# Patient Record
Sex: Female | Born: 1965 | Race: Black or African American | Hispanic: No | Marital: Married | Smoking: Never smoker
Health system: Southern US, Community
[De-identification: ages and names within clinical notes are randomized; demographics above are authoritative.]

## PROBLEM LIST (undated history)

## (undated) HISTORY — PX: ABDOMINAL HYSTERECTOMY: SHX81

---

## 1998-10-11 ENCOUNTER — Ambulatory Visit (HOSPITAL_COMMUNITY): Admission: RE | Admit: 1998-10-11 | Discharge: 1998-10-11 | Payer: Self-pay | Admitting: *Deleted

## 2004-05-10 ENCOUNTER — Other Ambulatory Visit: Admission: RE | Admit: 2004-05-10 | Discharge: 2004-05-10 | Payer: Self-pay | Admitting: Family Medicine

## 2006-06-17 ENCOUNTER — Other Ambulatory Visit: Admission: RE | Admit: 2006-06-17 | Discharge: 2006-06-17 | Payer: Self-pay | Admitting: Family Medicine

## 2009-04-25 ENCOUNTER — Other Ambulatory Visit: Admission: RE | Admit: 2009-04-25 | Discharge: 2009-04-25 | Payer: Self-pay | Admitting: Family Medicine

## 2010-04-06 ENCOUNTER — Encounter (HOSPITAL_COMMUNITY)
Admission: RE | Admit: 2010-04-06 | Discharge: 2010-04-06 | Disposition: A | Discharge status: Home / Self Care | Payer: 59 | Source: Ambulatory Visit | Attending: Obstetrics and Gynecology | Admitting: Obstetrics and Gynecology

## 2010-04-06 DIAGNOSIS — Z01812 Encounter for preprocedural laboratory examination: Secondary | ICD-10-CM | POA: Insufficient documentation

## 2010-04-06 LAB — CBC
MCV: 84 fL (ref 78.0–100.0)
Platelets: 290 10*3/uL (ref 150–400)

## 2010-04-06 LAB — SURGICAL PCR SCREEN
MRSA, PCR: NEGATIVE
Staphylococcus aureus: POSITIVE — AB

## 2010-04-13 ENCOUNTER — Observation Stay (HOSPITAL_COMMUNITY)
Admission: RE | Admit: 2010-04-13 | Discharge: 2010-04-15 | Disposition: A | Discharge status: Home Health | Payer: 59 | Source: Ambulatory Visit | Attending: Obstetrics and Gynecology | Admitting: Obstetrics and Gynecology

## 2010-04-13 DIAGNOSIS — N920 Excessive and frequent menstruation with regular cycle: Principal | ICD-10-CM | POA: Insufficient documentation

## 2010-04-13 DIAGNOSIS — D259 Leiomyoma of uterus, unspecified: Secondary | ICD-10-CM | POA: Insufficient documentation

## 2010-04-13 LAB — URINALYSIS, MICROSCOPIC ONLY
Ketones, ur: NEGATIVE mg/dL
Leukocytes, UA: NEGATIVE
Nitrite: NEGATIVE
Protein, ur: NEGATIVE mg/dL
Urine Glucose, Fasting: NEGATIVE mg/dL
Urobilinogen, UA: 0.2 mg/dL (ref 0.0–1.0)

## 2010-04-13 LAB — BASIC METABOLIC PANEL
BUN: 10 mg/dL (ref 6–23)
CO2: 25 mEq/L (ref 19–32)
Calcium: 9.7 mg/dL (ref 8.4–10.5)
Chloride: 103 mEq/L (ref 96–112)
Creatinine, Ser: 0.95 mg/dL (ref 0.4–1.2)
Glucose, Bld: 98 mg/dL (ref 70–99)

## 2010-04-14 LAB — CBC
Hemoglobin: 11.5 g/dL — ABNORMAL LOW (ref 12.0–15.0)
MCH: 29.5 pg (ref 26.0–34.0)
MCV: 88.5 fL (ref 78.0–100.0)
Platelets: 321 10*3/uL (ref 150–400)
RBC: 3.9 MIL/uL (ref 3.87–5.11)
WBC: 14.7 10*3/uL — ABNORMAL HIGH (ref 4.0–10.5)

## 2010-04-30 NOTE — Op Note (Signed)
[name], [name]           ACCOUNT NO.:  [id number]  MEDICAL RECORD NO.:  [id number]           PATIENT TYPE:  I  LOCATION:  9316                          FACILITY:  [facility]  PHYSICIAN:  Gerald Leitz, MD          DATE OF BIRTH:  1965/04/18  DATE OF PROCEDURE:  04/13/2010 DATE OF DISCHARGE:                              OPERATIVE REPORT   PREOPERATIVE DIAGNOSES: 1. Uterine fibroids. 2. Menorrhagia.  POSTOPERATIVE DIAGNOSES: 1. Uterine fibroids. 2. Menorrhagia. 3. Pelvic adhesive disease.  PROCEDURE:  Supracervical abdominal hysterectomy and lysis of adhesions.  SURGEON:  Gerald Leitz, MD  ASSISTANT:  Patsy Baltimore, MD  ANESTHESIA:  General.  FINDINGS:  Multiple uterine fibroids, 20-week size uterus, adhesions of the bladder to the lower uterine segment and cervix.  SPECIMEN:  Uterus and fibroids.  DISPOSITION OF SPECIMEN:  Pathology.  ESTIMATED BLOOD LOSS:  200 mL.  WEIGHT OF THE SPECIMEN:  1146 grams.  URINE OUTPUT:  400 plus mL.  FLUIDS:  Per Anesthesia.  COMPLICATIONS:  None.  DESCRIPTION OF PROCEDURE:  The patient was taken to the operating room where she was placed under general anesthesia.  She was prepped and draped in the usual sterile fashion and a Foley catheter was placed. She was placed in the supine position.  A midline incision was made with a scalpel, carried down to the underlying layer of fascia.  The fascia was incised and extended superiorly-inferiorly.  The rectus muscles were separated in the midline.  The peritoneum was identified and entered sharply.  The incision was extended superiorly-inferiorly with good visualization of the bladder.  The pelvis was examined with the findings noted above.  Due to the size of the uterus, a retractor was used initially.  The large fibroid uterus was lifted through the abdominal incision and the round ligaments were identified bilaterally.  They were clamped, transected, and suture ligated with 0-Vicryl.   The anterior leaf of the broad ligament was incised along the bladder reflection to the midline from both sides.  There were extensive adhesions on the right side.  These adhesions were released with Metzenbaum scissors. The bladder was gently dissected off the lower uterine segment of the cervix with a sponge stick.  The utero-ovarian ligaments on both sides were doubly clamped, transected, and suture ligated with 0-Vicryl.  The uterine arteries were then skeletonized bilaterally, clamped with Zeppelin clamps, transected, and suture ligated with 0-Vicryl.  At this point, due to the size of the uterus, decision was made to amputate the uterus from the cervix.  This was performed with a scalpel.  Cervical stump was oversewn with figure-of-eight sutures of 0-Vicryl.  The ovaries were examined bilaterally.  There was some slight oozing from the left ovary and a 2-0 Vicryl suture was placed.  Excellent hemostasis was then noted.  The abdomen was copiously irrigated.  The ovarian pedicle was then transfixed or anchored to the ipsilateral round ligament bilaterally.  Again, excellent hemostasis was noted.  The fascia was closed with zero double-stranded PDS.  The subcutaneous adipose tissue was reapproximated with 2-0 Vicryl in a running fashion. The skin was closed with  staples.  Sponge, lap, and needle counts were correct x2.  The patient was taken to the recovery room awake and in a stable condition.     Gerald Leitz, MD     TC/MEDQ  D:  04/13/2010  T:  04/14/2010  Job:  [id number]  Electronically Signed by Gerald Leitz MD on 04/30/2010 11:11:57 AM

## 2010-05-24 NOTE — Discharge Summary (Signed)
  Sheila, [name]           ACCOUNT NO.:  [id number]  MEDICAL RECORD NO.:  [id number]           PATIENT TYPE:  I  LOCATION:  9316                          FACILITY:  [facility]  PHYSICIAN:  Gerald Leitz, MD          DATE OF BIRTH:  06/29/1965  DATE OF ADMISSION:  04/13/2010 DATE OF DISCHARGE:  04/15/2010                              DISCHARGE SUMMARY   ADMISSION DIAGNOSES: 1. Uterine fibroids. 2. Menorrhagia.  DISCHARGE DIAGNOSIS: 1. Uterine fibroids. 2. Menorrhagia. 3. Pelvic adhesive disease. 4. Status post supracervical abdominal hysterectomy with lysis of     adhesions.  BRIEF HOSPITAL COURSE:  The patient underwent supracervical abdominal hysterectomy and lysis of adhesions on April 13, 2010, secondary to multiple uterine fibroids.  She was noted to have adhesions of the bladder in lower uterine segment during surgery and lysis of adhesions was performed as well.  She did well postoperatively.  Her hemoglobin on postop day #1 was 11.5.  She is discharged home on postop day #2 in stable and improved condition on the following medications:  Motrin and Percocet.  She will follow up on February 14 or 15th for staple removal.  CONDITION ON DISCHARGE:  Stable and improved.     Gerald Leitz, MD     TC/MEDQ  D:  05/11/2010  T:  05/11/2010  Job:  [id number]  Electronically Signed by Gerald Leitz MD on 05/24/2010 08:44:31 AM

## 2012-03-25 ENCOUNTER — Other Ambulatory Visit: Payer: Self-pay | Admitting: Family Medicine

## 2012-03-25 DIAGNOSIS — Z1231 Encounter for screening mammogram for malignant neoplasm of breast: Secondary | ICD-10-CM

## 2015-04-07 ENCOUNTER — Encounter (HOSPITAL_BASED_OUTPATIENT_CLINIC_OR_DEPARTMENT_OTHER): Payer: Self-pay | Admitting: Emergency Medicine

## 2015-04-07 ENCOUNTER — Emergency Department (HOSPITAL_BASED_OUTPATIENT_CLINIC_OR_DEPARTMENT_OTHER)
Admission: EM | Admit: 2015-04-07 | Discharge: 2015-04-07 | Disposition: A | Discharge status: Home / Self Care | Payer: BLUE CROSS/BLUE SHIELD | Attending: Emergency Medicine | Admitting: Emergency Medicine

## 2015-04-07 ENCOUNTER — Emergency Department (HOSPITAL_BASED_OUTPATIENT_CLINIC_OR_DEPARTMENT_OTHER): Payer: BLUE CROSS/BLUE SHIELD

## 2015-04-07 ENCOUNTER — Ambulatory Visit: Payer: Self-pay | Admitting: Family Medicine

## 2015-04-07 DIAGNOSIS — R0602 Shortness of breath: Secondary | ICD-10-CM | POA: Diagnosis not present

## 2015-04-07 DIAGNOSIS — R079 Chest pain, unspecified: Secondary | ICD-10-CM | POA: Diagnosis present

## 2015-04-07 DIAGNOSIS — I10 Essential (primary) hypertension: Secondary | ICD-10-CM | POA: Diagnosis not present

## 2015-04-07 DIAGNOSIS — Z7982 Long term (current) use of aspirin: Secondary | ICD-10-CM | POA: Diagnosis not present

## 2015-04-07 LAB — URINE MICROSCOPIC-ADD ON

## 2015-04-07 LAB — CBC
HCT: 41.9 % (ref 36.0–46.0)
Hemoglobin: 14.2 g/dL (ref 12.0–15.0)
MCH: 29.6 pg (ref 26.0–34.0)
MCHC: 33.9 g/dL (ref 30.0–36.0)
MCV: 87.3 fL (ref 78.0–100.0)
PLATELETS: 347 10*3/uL (ref 150–400)
RBC: 4.8 MIL/uL (ref 3.87–5.11)
RDW: 14 % (ref 11.5–15.5)
WBC: 6.2 10*3/uL (ref 4.0–10.5)

## 2015-04-07 LAB — BASIC METABOLIC PANEL
Anion gap: 8 (ref 5–15)
BUN: 13 mg/dL (ref 6–20)
CALCIUM: 9.4 mg/dL (ref 8.9–10.3)
CO2: 26 mmol/L (ref 22–32)
CREATININE: 0.94 mg/dL (ref 0.44–1.00)
Chloride: 103 mmol/L (ref 101–111)
GFR calc Af Amer: >60 mL/min (ref >60)
GLUCOSE: 97 mg/dL (ref 65–99)
POTASSIUM: 3.8 mmol/L (ref 3.5–5.1)
SODIUM: 137 mmol/L (ref 135–145)

## 2015-04-07 LAB — URINALYSIS, ROUTINE W REFLEX MICROSCOPIC
Bilirubin Urine: NEGATIVE
GLUCOSE, UA: NEGATIVE mg/dL
KETONES UR: NEGATIVE mg/dL
LEUKOCYTES UA: NEGATIVE
Nitrite: NEGATIVE
PH: 7 (ref 5.0–8.0)
Protein, ur: NEGATIVE mg/dL
Specific Gravity, Urine: 1.003 — ABNORMAL LOW (ref 1.005–1.030)

## 2015-04-07 LAB — TROPONIN I
Troponin I: <0.03 ng/mL (ref <0.031)
Troponin I: <0.03 ng/mL (ref <0.031)

## 2015-04-07 MED ORDER — AMLODIPINE BESYLATE 5 MG PO TABS
5.0000 mg | ORAL_TABLET | Freq: Once | ORAL | Status: AC
  Administered 2015-04-07: 5 mg via ORAL
  Filled 2015-04-07: qty 1

## 2015-04-07 MED ORDER — ASPIRIN 81 MG PO CHEW
324.0000 mg | CHEWABLE_TABLET | Freq: Once | ORAL | Status: AC
  Administered 2015-04-07: 324 mg via ORAL
  Filled 2015-04-07: qty 4

## 2015-04-07 MED ORDER — AMLODIPINE BESYLATE 5 MG PO TABS: 5.0000 mg | ORAL_TABLET | Freq: Every day | ORAL | Status: AC

## 2015-04-07 MED FILL — AMLODIPINE BESYLATE 5 MG TA: 5 | 30 days supply | Qty: 30 | Fill #0

## 2015-04-07 NOTE — ED Provider Notes (Signed)
CSN: [id number]     Arrival date & time 04/07/15  [id number] History   First MD Initiated Contact with Patient 04/07/15 [phone]     Chief Complaint  Patient presents with  . Chest Pain     (Consider location/radiation/quality/duration/timing/severity/associated sxs/prior Treatment) Patient is a 50 y.o. female presenting with chest pain. The history is provided by the patient.  Chest Pain Pain location:  Substernal area Pain quality: pressure   Pain radiates to:  Does not radiate Pain radiates to the back: no   Pain severity:  Moderate Onset quality:  Gradual Duration:  1 day Timing:  Intermittent Progression:  Waxing and waning Chronicity:  New Context: breathing and at rest   Context: no movement   Relieved by:  Nothing Worsened by:  Nothing tried Ineffective treatments:  None tried Associated symptoms: shortness of breath   Associated symptoms: no diaphoresis, no fever, no nausea and not vomiting     History reviewed. No pertinent past medical history. Past Surgical History  Procedure Laterality Date  . Abdominal hysterectomy     History reviewed. No pertinent family history. Social History  Substance Use Topics  . Smoking status: Never Smoker   . Smokeless tobacco: None  . Alcohol Use: Yes   OB History    No data available     Review of Systems  Constitutional: Negative for fever and diaphoresis.  Respiratory: Positive for shortness of breath.   Cardiovascular: Positive for chest pain.  Gastrointestinal: Negative for nausea and vomiting.  All other systems reviewed and are negative.     Allergies  Review of patient's allergies indicates no known allergies.  Home Medications   Prior to Admission medications   Medication Sig Start Date End Date Taking? Authorizing Provider  aspirin 81 MG tablet Take 81 mg by mouth daily.   Yes Historical Provider, MD   BP 196/111 mmHg  Pulse 88  Temp(Src) 98.4 F (36.9 C) (Oral)  Resp 20  Ht 5\' 2"  (1.575 m)  Wt 180 lb  (81.647 kg)  BMI 32.91 kg/m2  SpO2 100% Physical Exam  Constitutional: She is oriented to person, place, and time. She appears well-developed and well-nourished. No distress.  HENT:  Head: Normocephalic.  Eyes: Conjunctivae are normal.  Neck: Neck supple. No tracheal deviation present.  Cardiovascular: Normal rate, regular rhythm and normal heart sounds.   Pulmonary/Chest: Effort normal and breath sounds normal. No respiratory distress. She has no wheezes. She has no rales. She exhibits no tenderness.  Abdominal: Soft. She exhibits no distension.  Neurological: She is alert and oriented to person, place, and time.  Skin: Skin is warm and dry.  Psychiatric: She has a normal mood and affect.  Vitals reviewed.   ED Course  Procedures (including critical care time) Labs Review Labs Reviewed  URINALYSIS, ROUTINE W REFLEX MICROSCOPIC (NOT AT [facility]) - Abnormal; Notable for the following:    Specific Gravity, Urine 1.003 (*)    Hgb urine dipstick TRACE (*)    All other components within normal limits  URINE MICROSCOPIC-ADD ON - Abnormal; Notable for the following:    Squamous Epithelial / LPF 0-5 (*)    Bacteria, UA RARE (*)    All other components within normal limits  CBC  BASIC METABOLIC PANEL  TROPONIN I  TROPONIN I    Imaging Review Dg Chest 2 View  04/07/2015  CLINICAL DATA:  Chest tightness, shortness of breath EXAM: CHEST  2 VIEW COMPARISON:  None. FINDINGS: The heart size and mediastinal contours  are within normal limits. Both lungs are clear. The visualized skeletal structures are unremarkable. IMPRESSION: No active cardiopulmonary disease. Electronically Signed   By: Kathreen Devoid   On: 04/07/2015 10:07   I have personally reviewed and evaluated these images and lab results as part of my medical decision-making.   EKG Interpretation   Date/Time:  Thursday April 07 2015 09:41:05 EST Ventricular Rate:  86 PR Interval:  173 QRS Duration: 100 QT Interval:  378 QTC  Calculation: 452 R Axis:   -53 Text Interpretation:  Sinus rhythm Left anterior fascicular block  Otherwise normal ECG No previous tracing Confirmed by Jannessa Ogden MD, Quillian Quince  [id number]) on 04/07/2015 11:44:46 AM      MDM   Final diagnoses:  Chest pain, unspecified chest pain type  Essential hypertension    50 y.o. female presents with chest pain that started intermittently last night and continued into this morning that was described as pressure, nonradiating, is worse with exertion and is potentially concerning for angina by history. Only known risk factors are hypertension and obesity, heart score is 3 with no evidence of repolarization abnormalities on EKG and negative serial troponins. Has been asymptomatic throughout her emergency department course. I discussed this case with Dr Irish Lack of cardiology as her pain is of potential concern for cardiac etiology. He agreed with management and will schedule the patient for outpatient follow-up within the week to further evaluate her chest pain after starting on a calcium channel blocker. The patient will reestablish care with her primary care physician to further monitor her health. No signs of active ischemia or ACS on today's workup and monitoring. Plan to follow up with cardiology, PCP as needed and return precautions discussed for worsening or new concerning symptoms.     Leo Grosser, MD 04/07/15 1420

## 2015-04-07 NOTE — ED Notes (Signed)
Pt reports onset of pressure to central chest last pm, no radiation, + sob, pain is intermittent

## 2015-04-07 NOTE — Discharge Instructions (Signed)
Nonspecific Chest Pain  °Chest pain can be caused by many different conditions. There is always a chance that your pain could be related to something serious, such as a heart attack or a blood clot in your lungs. Chest pain can also be caused by conditions that are not life-threatening. If you have chest pain, it is very important to follow up with your health care provider. °CAUSES  °Chest pain can be caused by: °· Heartburn. °· Pneumonia or bronchitis. °· Anxiety or stress. °· Inflammation around your heart (pericarditis) or lung (pleuritis or pleurisy). °· A blood clot in your lung. °· A collapsed lung (pneumothorax). It can develop suddenly on its own (spontaneous pneumothorax) or from trauma to the chest. °· Shingles infection (varicella-zoster virus). °· Heart attack. °· Damage to the bones, muscles, and cartilage that make up your chest wall. This can include: °¨ Bruised bones due to injury. °¨ Strained muscles or cartilage due to frequent or repeated coughing or overwork. °¨ Fracture to one or more ribs. °¨ Sore cartilage due to inflammation (costochondritis). °RISK FACTORS  °Risk factors for chest pain may include: °· Activities that increase your risk for trauma or injury to your chest. °· Respiratory infections or conditions that cause frequent coughing. °· Medical conditions or overeating that can cause heartburn. °· Heart disease or family history of heart disease. °· Conditions or health behaviors that increase your risk of developing a blood clot. °· Having had chicken pox (varicella zoster). °SIGNS AND SYMPTOMS °Chest pain can feel like: °· Burning or tingling on the surface of your chest or deep in your chest. °· Crushing, pressure, aching, or squeezing pain. °· Dull or sharp pain that is worse when you move, cough, or take a deep breath. °· Pain that is also felt in your back, neck, shoulder, or arm, or pain that spreads to any of these areas. °Your chest pain may come and go, or it may stay  constant. °DIAGNOSIS °Lab tests or other studies may be needed to find the cause of your pain. Your health care provider may have you take a test called an ambulatory ECG (electrocardiogram). An ECG records your heartbeat patterns at the time the test is performed. You may also have other tests, such as: °· Transthoracic echocardiogram (TTE). During echocardiography, sound waves are used to create a picture of all of the heart structures and to look at how blood flows through your heart. °· Transesophageal echocardiogram (TEE). This is a more advanced imaging test that obtains images from inside your body. It allows your health care provider to see your heart in finer detail. °· Cardiac monitoring. This allows your health care provider to monitor your heart rate and rhythm in real time. °· Holter monitor. This is a portable device that records your heartbeat and can help to diagnose abnormal heartbeats. It allows your health care provider to track your heart activity for several days, if needed. °· Stress tests. These can be done through exercise or by taking medicine that makes your heart beat more quickly. °· Blood tests. °· Imaging tests. °TREATMENT  °Your treatment depends on what is causing your chest pain. Treatment may include: °· Medicines. These may include: °¨ Acid blockers for heartburn. °¨ Anti-inflammatory medicine. °¨ Pain medicine for inflammatory conditions. °¨ Antibiotic medicine, if an infection is present. °¨ Medicines to dissolve blood clots. °¨ Medicines to treat coronary artery disease. °· Supportive care for conditions that do not require medicines. This may include: °¨ Resting. °¨ Applying heat   or cold packs to injured areas. °¨ Limiting activities until pain decreases. °HOME CARE INSTRUCTIONS °· If you were prescribed an antibiotic medicine, finish it all even if you start to feel better. °· Avoid any activities that bring on chest pain. °· Do not use any tobacco products, including  cigarettes, chewing tobacco, or electronic cigarettes. If you need help quitting, ask your health care provider. °· Do not drink alcohol. °· Take medicines only as directed by your health care provider. °· Keep all follow-up visits as directed by your health care provider. This is important. This includes any further testing if your chest pain does not go away. °· If heartburn is the cause for your chest pain, you may be told to keep your head raised (elevated) while sleeping. This reduces the chance that acid will go from your stomach into your esophagus. °· Make lifestyle changes as directed by your health care provider. These may include: °¨ Getting regular exercise. Ask your health care provider to suggest some activities that are safe for you. °¨ Eating a heart-healthy diet. A registered dietitian can help you to learn healthy eating options. °¨ Maintaining a healthy weight. °¨ Managing diabetes, if necessary. °¨ Reducing stress. °SEEK MEDICAL CARE IF: °· Your chest pain does not go away after treatment. °· You have a rash with blisters on your chest. °· You have a fever. °SEEK IMMEDIATE MEDICAL CARE IF:  °· Your chest pain is worse. °· You have an increasing cough, or you cough up blood. °· You have severe abdominal pain. °· You have severe weakness. °· You faint. °· You have chills. °· You have sudden, unexplained chest discomfort. °· You have sudden, unexplained discomfort in your arms, back, neck, or jaw. °· You have shortness of breath at any time. °· You suddenly start to sweat, or your skin gets clammy. °· You feel nauseous or you vomit. °· You suddenly feel light-headed or dizzy. °· Your heart begins to beat quickly, or it feels like it is skipping beats. °These symptoms may represent a serious problem that is an emergency. Do not wait to see if the symptoms will go away. Get medical help right away. Call your local emergency services (911 in the [place] Do not drive yourself to the hospital. °  °This  information is not intended to replace advice given to you by your health care provider. Make sure you discuss any questions you have with your health care provider. °  °Document Released: 11/29/2004 Document Revised: 03/12/2014 Document Reviewed: 09/25/2013 °[employer]vier Interactive Patient Education ©2016 [employer] ° °

## 2016-10-23 IMAGING — CR DG CHEST 2V
2 series · 2 of 2 positions shown · non-contrast
Comparison: None.

CLINICAL DATA: Chest tightness, shortness of breath

EXAM:
CHEST  2 VIEW

[w chest pa]
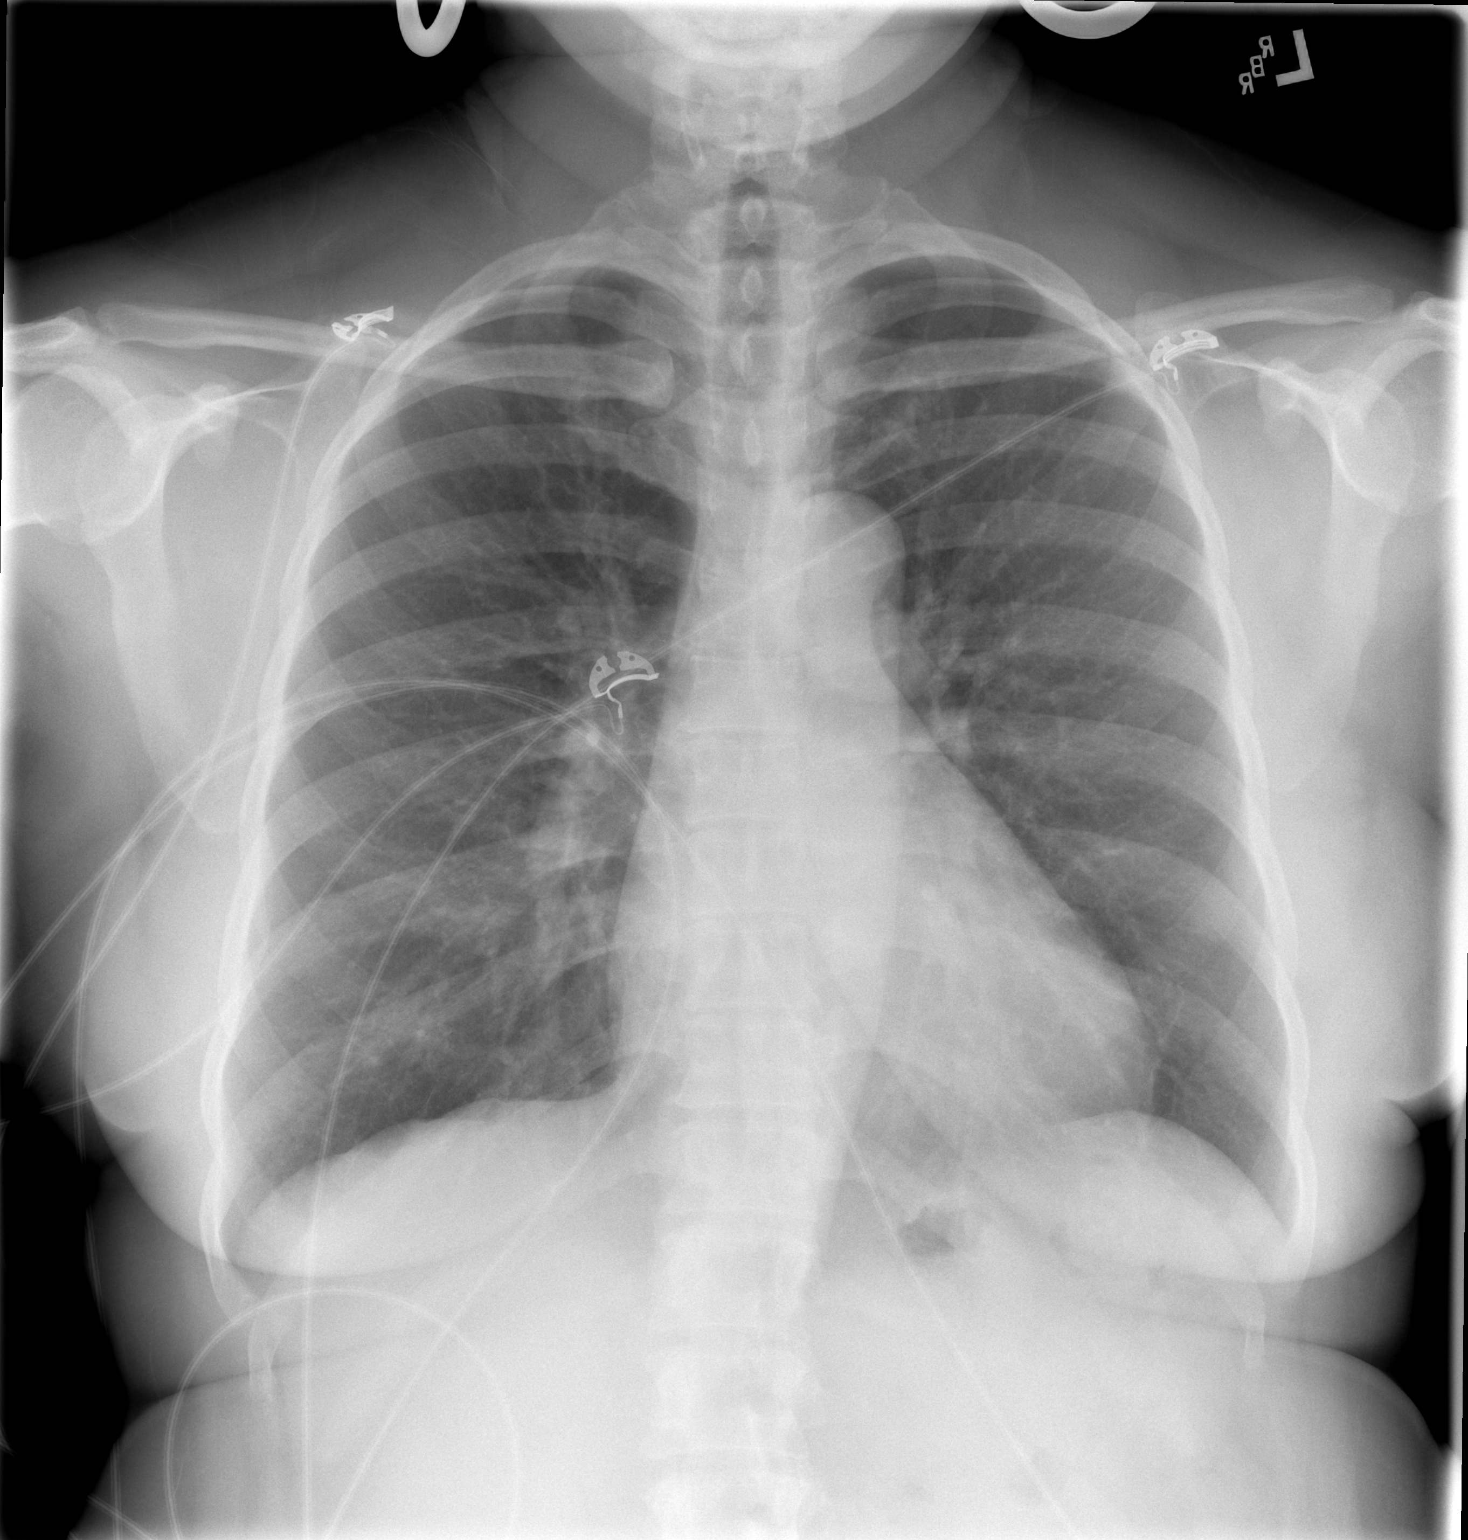

[w chest lat]
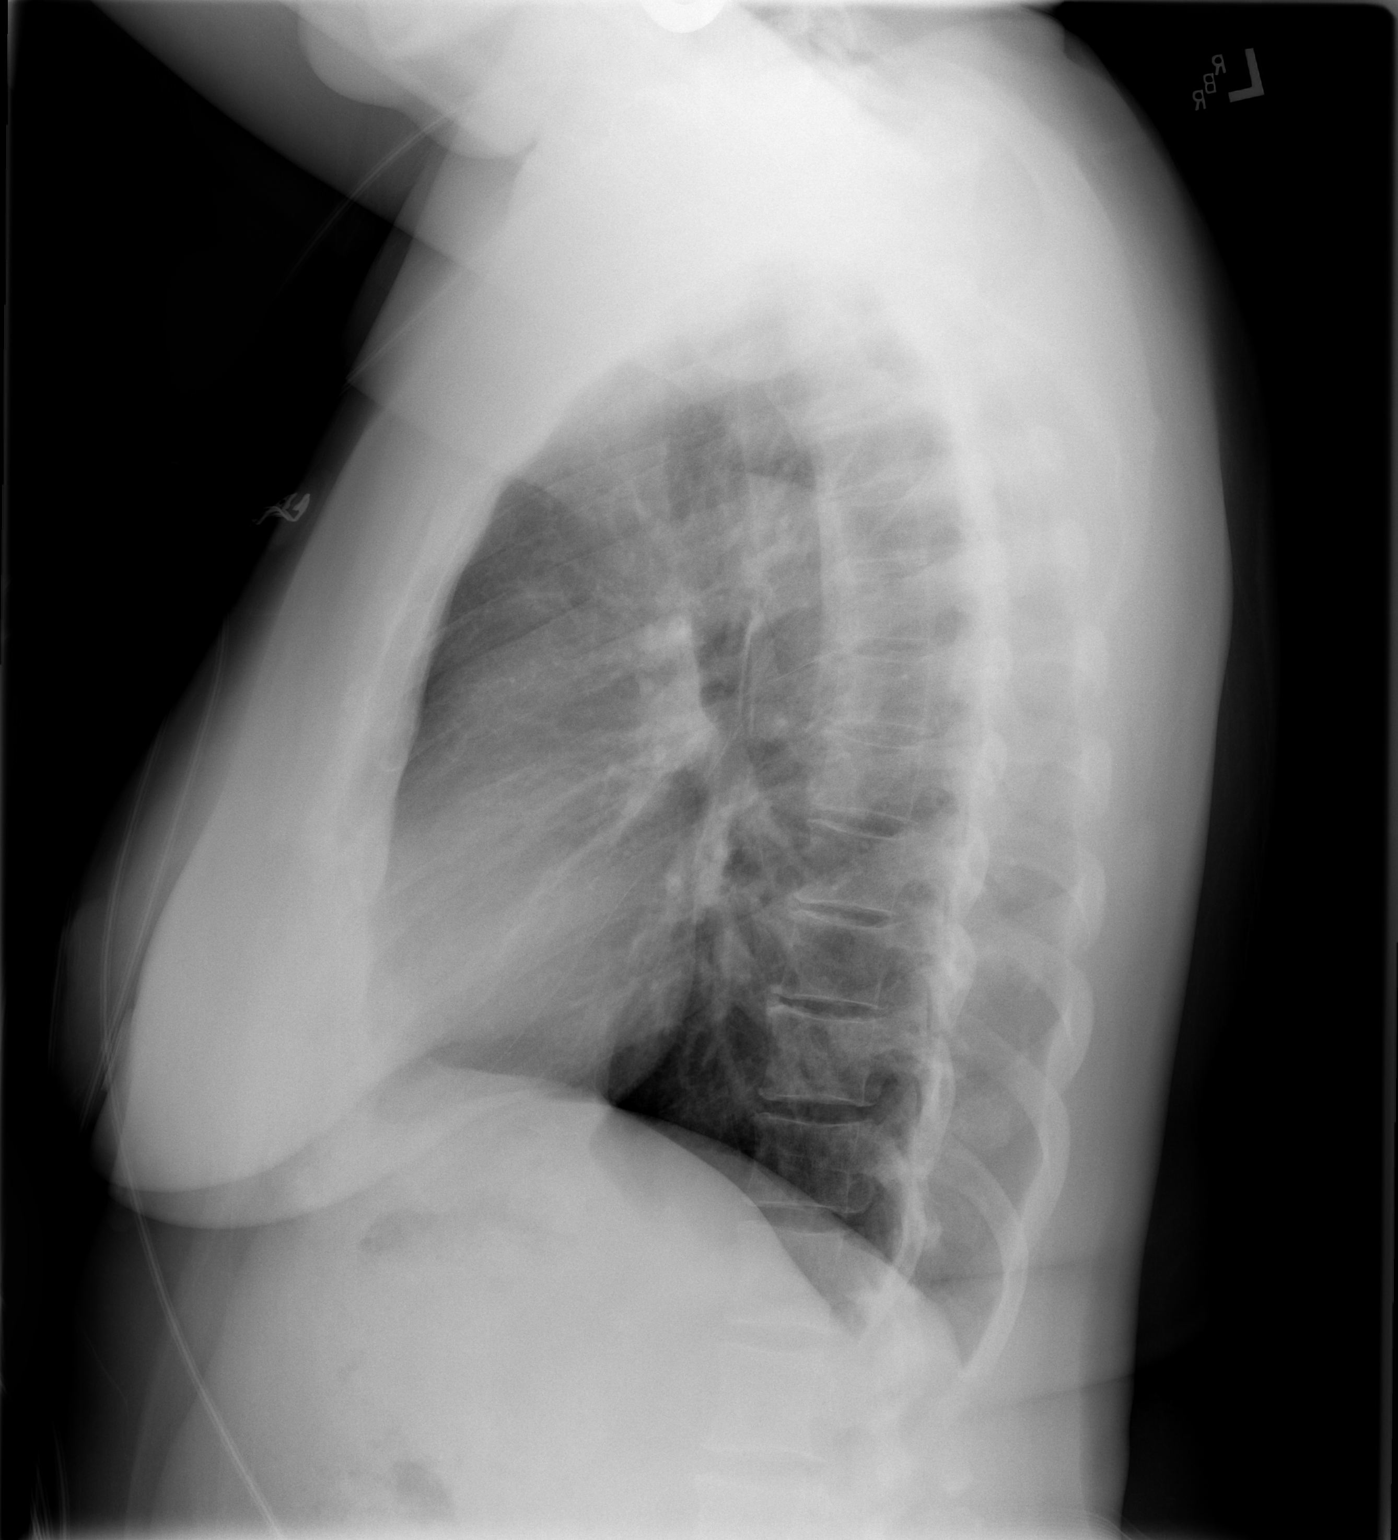

[2 of 2 positions shown; findings below may reference images not displayed]

FINDINGS: The heart size and mediastinal contours are within normal limits.
Both lungs are clear. The visualized skeletal structures are
unremarkable.
IMPRESSION: No active cardiopulmonary disease.
